# Patient Record
Sex: Female | Born: 1965 | Hispanic: Yes | Marital: Married | Smoking: Never smoker
Health system: Southern US, Community
[De-identification: ages and names within clinical notes are randomized; demographics above are authoritative.]

## PROBLEM LIST (undated history)

## (undated) HISTORY — PX: HERNIA REPAIR: SHX51

## (undated) HISTORY — PX: ABDOMINAL HYSTERECTOMY: SHX81

## (undated) HISTORY — PX: APPENDECTOMY: SHX54

---

## 2021-04-06 ENCOUNTER — Other Ambulatory Visit: Payer: Self-pay | Admitting: Sports Medicine

## 2021-04-06 ENCOUNTER — Ambulatory Visit
Admission: RE | Admit: 2021-04-06 | Discharge: 2021-04-06 | Disposition: A | Discharge status: Home / Self Care | Payer: Managed Care, Other (non HMO) | Source: Ambulatory Visit | Attending: Sports Medicine | Admitting: Sports Medicine

## 2021-04-06 DIAGNOSIS — M25561 Pain in right knee: Secondary | ICD-10-CM

## 2021-04-15 ENCOUNTER — Encounter (HOSPITAL_COMMUNITY): Payer: Self-pay

## 2021-04-15 ENCOUNTER — Other Ambulatory Visit: Payer: Self-pay

## 2021-04-15 ENCOUNTER — Emergency Department (HOSPITAL_COMMUNITY): Payer: Managed Care, Other (non HMO)

## 2021-04-15 ENCOUNTER — Emergency Department (HOSPITAL_COMMUNITY)
Admission: EM | Admit: 2021-04-15 | Discharge: 2021-04-15 | Disposition: A | Discharge status: Home / Self Care | Payer: Managed Care, Other (non HMO) | Attending: Emergency Medicine | Admitting: Emergency Medicine

## 2021-04-15 DIAGNOSIS — M25561 Pain in right knee: Secondary | ICD-10-CM

## 2021-04-15 DIAGNOSIS — W010XXA Fall on same level from slipping, tripping and stumbling without subsequent striking against object, initial encounter: Secondary | ICD-10-CM | POA: Diagnosis not present

## 2021-04-15 MED ORDER — KETOROLAC TROMETHAMINE 60 MG/2ML IM SOLN
60.0000 mg | Freq: Once | INTRAMUSCULAR | Status: AC
  Administered 2021-04-15: 60 mg via INTRAMUSCULAR
  Filled 2021-04-15: qty 2

## 2021-04-15 MED ORDER — HYDROCODONE-ACETAMINOPHEN 5-325 MG PO TABS: 1.0000 | ORAL_TABLET | Freq: Four times a day (QID) | ORAL | 0 refills | Status: AC | PRN

## 2021-04-15 NOTE — ED Triage Notes (Signed)
Pt c/o pain to the right knee x 1 month after having a fall. Pt states the pain and swelling has not improved.

## 2021-04-15 NOTE — ED Provider Notes (Addendum)
[facility]-EMERGENCY DEPT Provider Note   CSN: [id number] Arrival date & time: 04/15/21  0753    History Chief Complaint  Patient presents with   Knee Pain    Lisa Gaines is a 55 y.o. female who is here today for complaints of right knee pain x 1 month.  The patient states that she was at work 1 day and tripped and fell directly on her right knee.  She did not immediately have pain and "thought nothing of it at first."  A few days later, she started having significant right knee pain.  She has been seen by outside doctors for this, including Dr. Lyn Hollingshead from sports medicine.  She reports that she was put on prednisone for 5 days which alleviated her pain, but then the pain came back after she stopped taking the medication.  Has also had synovial fluid drawn from the knee and was given a cortisone shot a couple weeks ago.  She has been taking ibuprofen and Tylenol which have not alleviated the pain.  Her sports doctor prescribed her Celebrex which she has been taking but does not seem to be helping.  She has also tried wrapping the knee and has worn a brace on it at one point.  She has been icing it every night and using heat pads as well.  She states that walking exacerbates her pain.  She is also been referred to physical therapy and is supposed to have her first appointment tomorrow.  She did not feel the knee pop out of place.    History reviewed. No pertinent past medical history.  There are no problems to display for this patient.   Past Surgical History:  Procedure Laterality Date   ABDOMINAL HYSTERECTOMY     APPENDECTOMY     HERNIA REPAIR       OB History   No obstetric history on file.     No family history on file.  Social History   Tobacco Use   Smoking status: Never   Smokeless tobacco: Never  Substance Use Topics   Alcohol use: Yes   Drug use: Never    Home Medications Prior to Admission medications   Medication Sig Start  Date End Date Taking? Authorizing Provider  HYDROcodone-acetaminophen (NORCO/VICODIN) 5-325 MG tablet Take 1 tablet by mouth every 6 (six) hours as needed for severe pain. 04/15/21  Yes Gwyneth Sprout, MD    Allergies    Ciprofloxacin  Review of Systems   Review of Systems  Constitutional: Negative.   HENT: Negative.    Eyes: Negative.   Respiratory: Negative.    Cardiovascular: Negative.   Gastrointestinal: Negative.   Endocrine: Negative.   Genitourinary: Negative.   Musculoskeletal:        Right knee pain and swelling.  Skin: Negative.   Allergic/Immunologic: Negative.   Neurological: Negative.   Hematological: Negative.   Psychiatric/Behavioral: Negative.     Physical Exam Updated Vital Signs BP (!) 176/98   Temp 97.9 F (36.6 C)   Resp 17   SpO2 98%   Physical Exam Constitutional:      General: She is not in acute distress.    Appearance: Normal appearance.  HENT:     Head: Normocephalic and atraumatic.  Eyes:     Extraocular Movements: Extraocular movements intact.  Cardiovascular:     Rate and Rhythm: Normal rate and regular rhythm.  Pulmonary:     Effort: Pulmonary effort is normal. No respiratory distress.  Abdominal:  General: Abdomen is flat. There is no distension.  Musculoskeletal:     Comments: Right knee: No erythema of the knee.  There is swelling and point tenderness at the anterior medial aspect of the knee.  No crepitus.  The patella is stable. Left knee is unremarkable.  Skin:    General: Skin is warm and dry.  Neurological:     General: No focal deficit present.     Mental Status: She is alert and oriented to person, place, and time. Mental status is at baseline.  Psychiatric:        Mood and Affect: Mood normal.        Behavior: Behavior normal.    ED Results / Procedures / Treatments   Labs (all labs ordered are listed, but only abnormal results are displayed) Labs Reviewed - No data to display  EKG None  Radiology DG  Knee AP/LAT W/Sunrise Right  Result Date: 04/15/2021 CLINICAL DATA:  Anterior knee pain EXAM: RIGHT KNEE 3 VIEWS COMPARISON:  None. FINDINGS: There is no evidence of acute fracture or dislocation. There is a trace joint effusion. There is no significant arthritis IMPRESSION: No evidence of acute fracture.  Trace joint effusion. Electronically Signed   By: Caprice Renshaw M.D.   On: 04/15/2021 11:03    Procedures Procedures   Medications Ordered in ED Medications  ketorolac (TORADOL) injection 60 mg (60 mg Intramuscular Given 04/15/21 1033)    ED Course  I have reviewed the triage vital signs and the nursing notes.  Pertinent labs & imaging results that were available during my care of the patient were reviewed by me and considered in my medical decision making (see chart for details).    MDM Rules/Calculators/A&P                         This is a 56 year old female who presents to the ED today with a 1 month history of right knee pain after direct fall to the knee.  The pain was not immediate.  She has had extensive work-up for this over the past month by her PCP and sports medicine doctor.  Left knee x-rays were obtained on 11/22 and found minimal osteoarthritis but no signs of fracture or acute injury.  Patient states that she also had some fluid drawn from the knee with cortisone shot, however she continues to have very pain in the right knee.  Exam is remarkable for point tenderness and swelling at the anterior medial aspect of the knee.  We will obtain repeat x-rays of the right knee with sunrise view today for further evaluation.   10:30 AM: Given IM Toradol x 1 for pain  11:00 AM: Right knee films show no evidence of acute fracture.  Patient was discharged home in stable condition with possible ligamentous injury, less likely patella fracture given negative imaging and less likely more severe injury such as ACL/PCL tear given not immediate onset of pain and exam findings.  Patient was  given a right knee sleeve and crutches as well as a few hydrocodone PRN for pain.  She was advised to follow-up with her sports medicine doctor for further evaluation of the injury.   Final Clinical Impression(s) / ED Diagnoses Final diagnoses:  Right knee pain  Acute pain of right knee    Rx / DC Orders ED Discharge Orders          Ordered    HYDROcodone-acetaminophen (NORCO/VICODIN) 5-325 MG tablet  Every  6 hours PRN        04/15/21 1121             Andrey Campanile, MD 04/15/21 1134    Andrey Campanile, MD 04/15/21 1135    Gwyneth Sprout, MD 04/15/21 1320

## 2021-04-15 NOTE — Discharge Instructions (Addendum)
You were seen in the emergency department for right knee pain.  While you were here, we got repeat x-rays which did not show a fracture.  We gave you a one-time dose of Toradol and have discharged you with crutches and a right knee sleeve.  I have written you a note for work that you must perform nonweightbearing activities only.  Continue Tylenol and ibuprofen as needed, and we have also provided a few doses of hydrocodone for relief of your pain.  Please return to the ED for any worsening symptoms such as high fever, significant redness and swelling of the right knee.

## 2021-04-19 ENCOUNTER — Ambulatory Visit: Payer: Self-pay | Admitting: Physician Assistant

## 2021-04-20 ENCOUNTER — Other Ambulatory Visit: Payer: Self-pay | Admitting: Sports Medicine

## 2021-04-20 DIAGNOSIS — M25561 Pain in right knee: Secondary | ICD-10-CM

## 2021-04-21 ENCOUNTER — Other Ambulatory Visit: Payer: Self-pay

## 2021-04-21 ENCOUNTER — Ambulatory Visit
Admission: RE | Admit: 2021-04-21 | Discharge: 2021-04-21 | Disposition: A | Discharge status: Home / Self Care | Payer: Managed Care, Other (non HMO) | Source: Ambulatory Visit | Attending: Sports Medicine | Admitting: Sports Medicine

## 2021-04-21 DIAGNOSIS — M25561 Pain in right knee: Secondary | ICD-10-CM

## 2021-04-24 ENCOUNTER — Other Ambulatory Visit: Payer: Managed Care, Other (non HMO)

## 2021-09-07 DIAGNOSIS — R0789 Other chest pain: Secondary | ICD-10-CM | POA: Diagnosis not present

## 2021-09-30 DIAGNOSIS — M940 Chondrocostal junction syndrome [Tietze]: Secondary | ICD-10-CM | POA: Diagnosis not present

## 2021-09-30 DIAGNOSIS — H109 Unspecified conjunctivitis: Secondary | ICD-10-CM | POA: Diagnosis not present

## 2021-10-26 DIAGNOSIS — R109 Unspecified abdominal pain: Secondary | ICD-10-CM | POA: Diagnosis not present

## 2021-10-26 DIAGNOSIS — N390 Urinary tract infection, site not specified: Secondary | ICD-10-CM | POA: Diagnosis not present

## 2021-12-14 DIAGNOSIS — H10023 Other mucopurulent conjunctivitis, bilateral: Secondary | ICD-10-CM | POA: Diagnosis not present

## 2023-04-15 IMAGING — MR MR KNEE*R* W/O CM
6 series · 40 of 40 positions shown · non-contrast
Comparison: X-ray knee 04/15/2021.

CLINICAL DATA: Right medial knee pain with weakness due to trip and
fell over a pallet 5 weeks ago.

EXAM:
MRI OF THE RIGHT KNEE WITHOUT CONTRAST
TECHNIQUE: Multiplanar, multisequence MR imaging of the knee was performed. No
intravenous contrast was administered.

[Series 6: T2 fat-sat · axial · right · 4.0mm · 0.50mm/px · z∈[-79,+74]mm · 8 of 36 slices shown (1 of 3)]
[im 1/36]
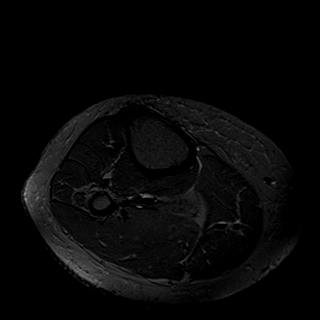
[im 6/36]
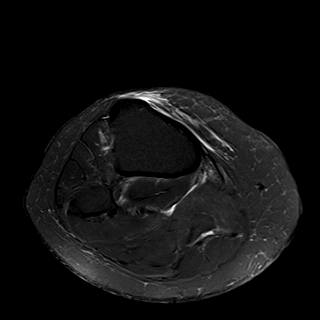
[im 11/36]
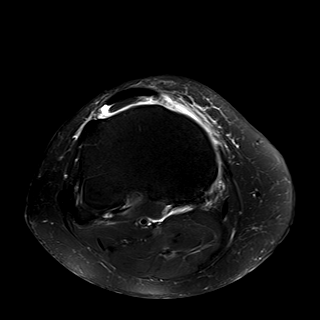
[im 16/36]
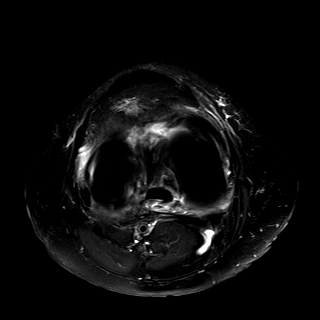
[im 21/36]
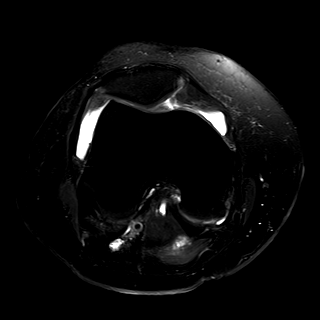
[im 26/36]
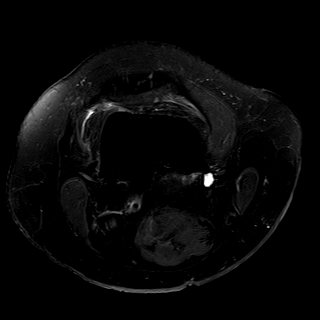
[im 31/36]
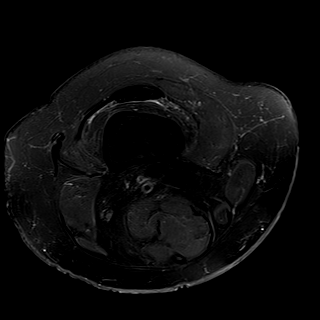
[im 36/36]
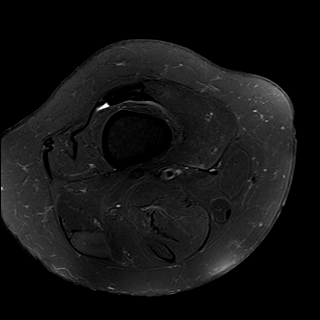

[Series 7: T2 fat-sat · coronal · right · 4.0mm · 0.47mm/px · 6 of 28 slices shown (2 of 3)]
[im 1/28]
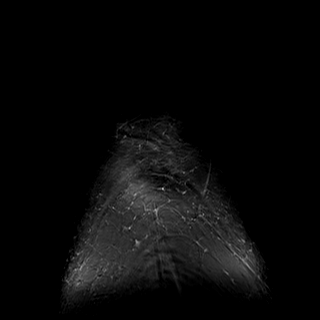
[im 6/28]
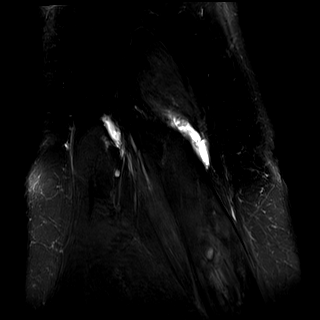
[im 11/28]
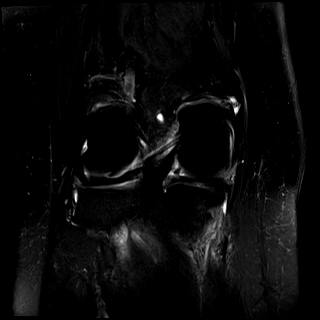
[im 17/28]
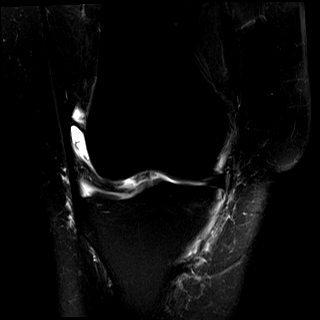
[im 22/28]
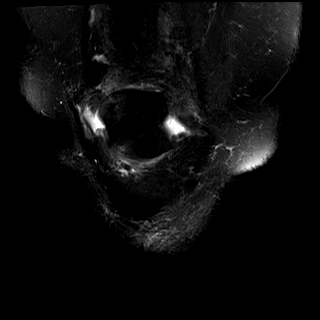
[im 28/28]
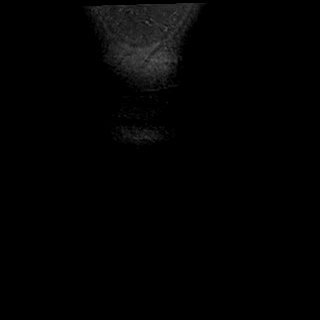

[Series 8: T1 · coronal · right · 4.0mm · 0.47mm/px · 6 of 28 slices shown]
[im 1/28]
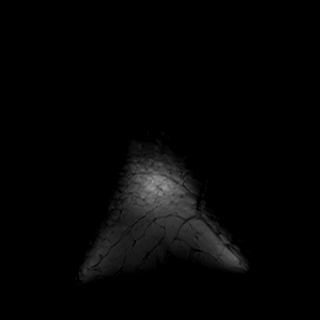
[im 6/28]
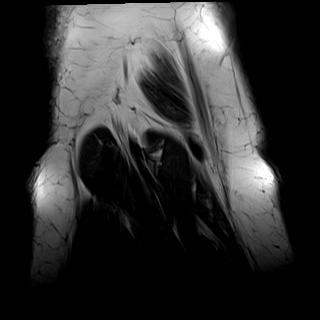
[im 11/28]
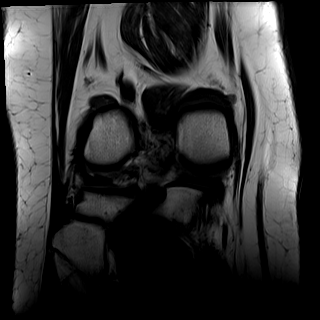
[im 17/28]
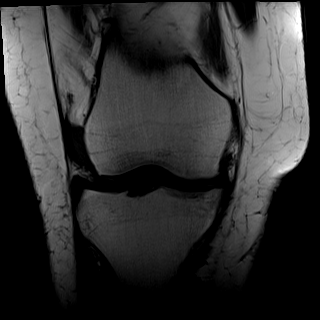
[im 22/28]
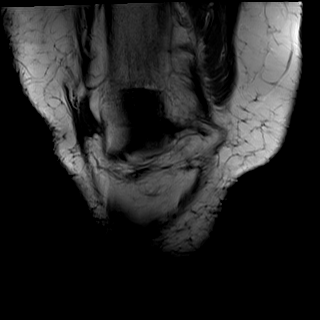
[im 28/28]
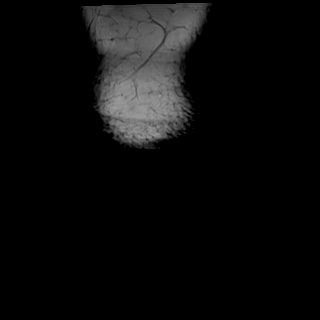

[Series 9: PD fat-sat · coronal · right · 3.0mm · 0.47mm/px · 6 of 28 slices shown (1 of 2)]
[im 1/28]
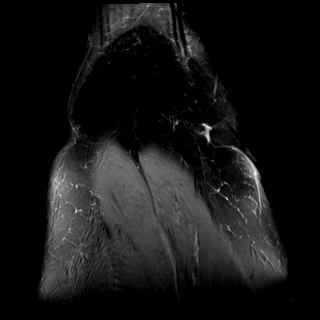
[im 6/28]
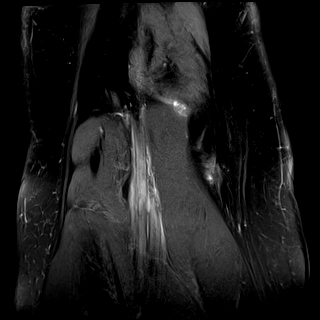
[im 11/28]
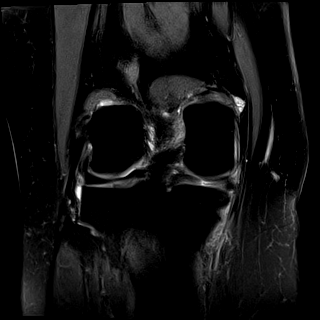
[im 17/28]
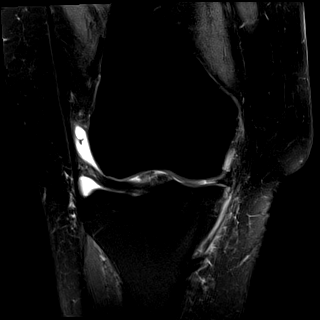
[im 22/28]
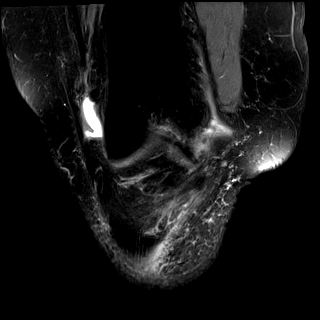
[im 28/28]
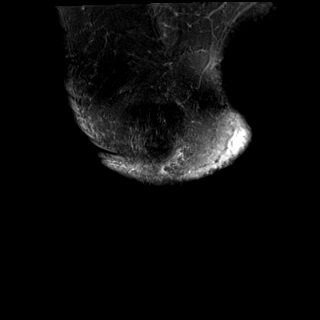

[Series 10: PD fat-sat · sagittal · right · 3.0mm · 0.39mm/px · 7 of 31 slices shown (2 of 2)]
[im 1/31]
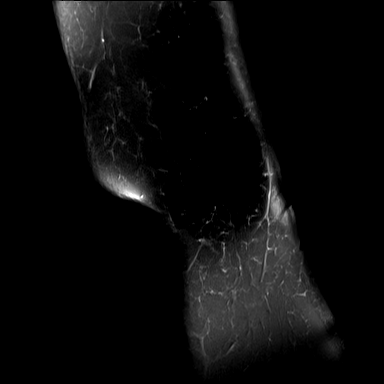
[im 6/31]
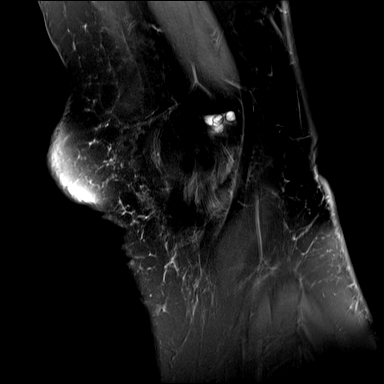
[im 11/31]
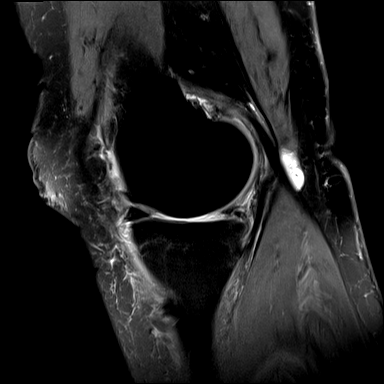
[im 16/31]
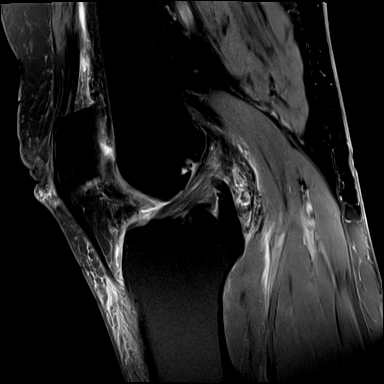
[im 21/31]
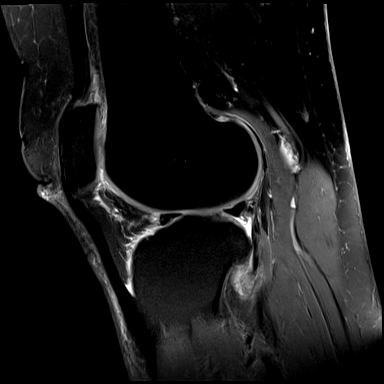
[im 26/31]
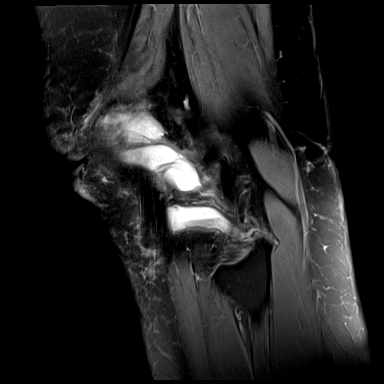
[im 31/31]
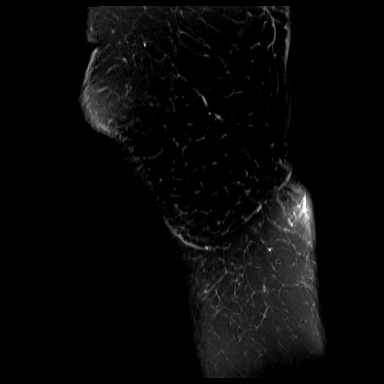

[Series 11: T2 fat-sat · sagittal · right · 3.0mm · 0.39mm/px · 7 of 31 slices shown (3 of 3)]
[im 1/31]
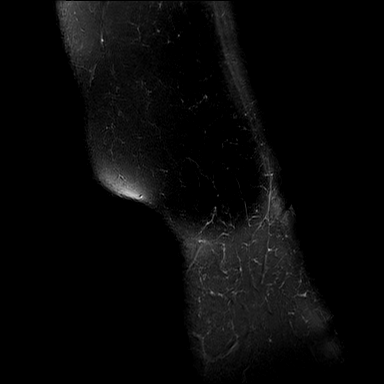
[im 6/31]
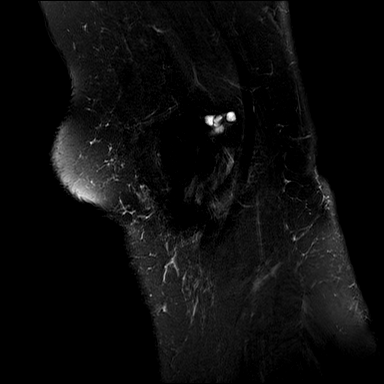
[im 11/31]
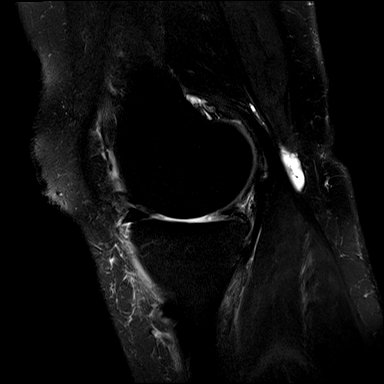
[im 16/31]
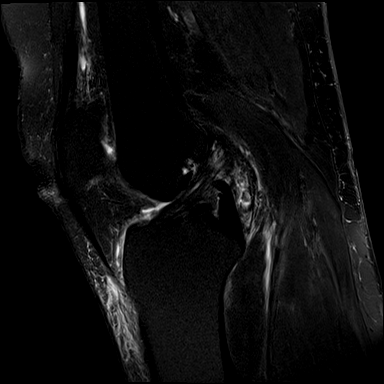
[im 21/31]
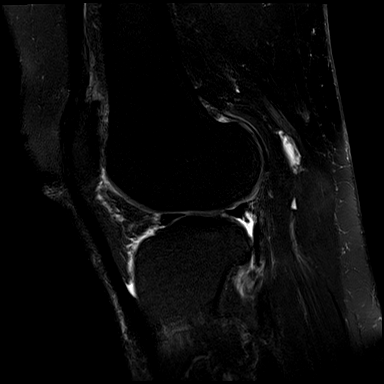
[im 26/31]
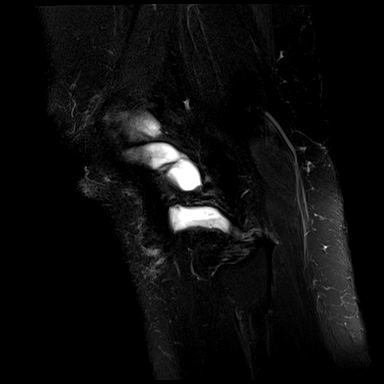
[im 31/31]
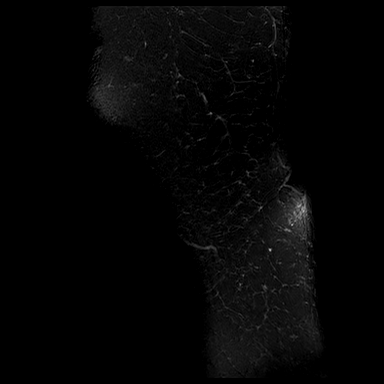

[40 of 40 positions shown; findings below may reference images not displayed]

FINDINGS: MENISCI

Medial meniscus: Complex degenerative tearing of the posterior horn
and body of the medial meniscus, with radial component and mild
meniscal extrusion.

Lateral meniscus: Intact.

LIGAMENTS

Cruciates: Intact ACL and PCL.

Collaterals: Mild bowing of the MCL related to medial meniscal
extrusion. The medial collateral ligament is intact. The lateral
collateral ligament complex is intact.

CARTILAGE

Patellofemoral:  Mild to moderate chondrosis.

Medial: Broad area of near full-thickness cartilage loss along the
weight-bearing surfaces.

Lateral:  Mild chondrosis.

Joint:  Moderate size joint effusion.

Popliteal Fossa:  Small Baker cyst.

Extensor Mechanism: Intact quadriceps tendon and patellar tendon.

Bones: Subchondral marrow edema within the medial tibial plateau
related to overlying cartilage loss/degenerative arthritis. Mild
tricompartment osteophyte formation.

Other: None.
IMPRESSION: 1. Complex degenerative tearing of the posterior horn and body of
the medial meniscus, with radial component and mild extrusion.
2. Tricompartment osteoarthritis, worst in the medial compartment
with broad area of near full-thickness cartilage loss along the
weight-bearing surfaces.
3. Moderate-sized joint effusion.  Small Baker cyst.
